# Patient Record
Sex: Male | Born: 1965 | Race: White | Hispanic: No | Marital: Single | Smoking: Never smoker
Health system: Southern US, Community
[De-identification: ages and names within clinical notes are randomized; demographics above are authoritative.]

## PROBLEM LIST (undated history)

## (undated) HISTORY — PX: OTHER SURGICAL HISTORY: SHX169

---

## 1999-01-15 ENCOUNTER — Emergency Department (HOSPITAL_COMMUNITY): Admission: EM | Admit: 1999-01-15 | Discharge: 1999-01-15 | Payer: Self-pay | Admitting: Emergency Medicine

## 2013-02-24 ENCOUNTER — Emergency Department: Payer: Self-pay | Admitting: Emergency Medicine

## 2014-06-14 IMAGING — CR LEFT THUMB 2+V
1 series · 3 of 3 positions shown · non-contrast
Comparison: none

REASON FOR EXAM: hooked, pain, trauma
COMMENTS:

PROCEDURE:     DXR - DXR THUMB LEFT HAND (1ST DIGIT)  - February 24, 2013  [DATE]
RESULT:     Comparison:  None

[Series 1: x finger obl left · 0.14mm/px · 3 of 3 slices shown]
[im 1/3]
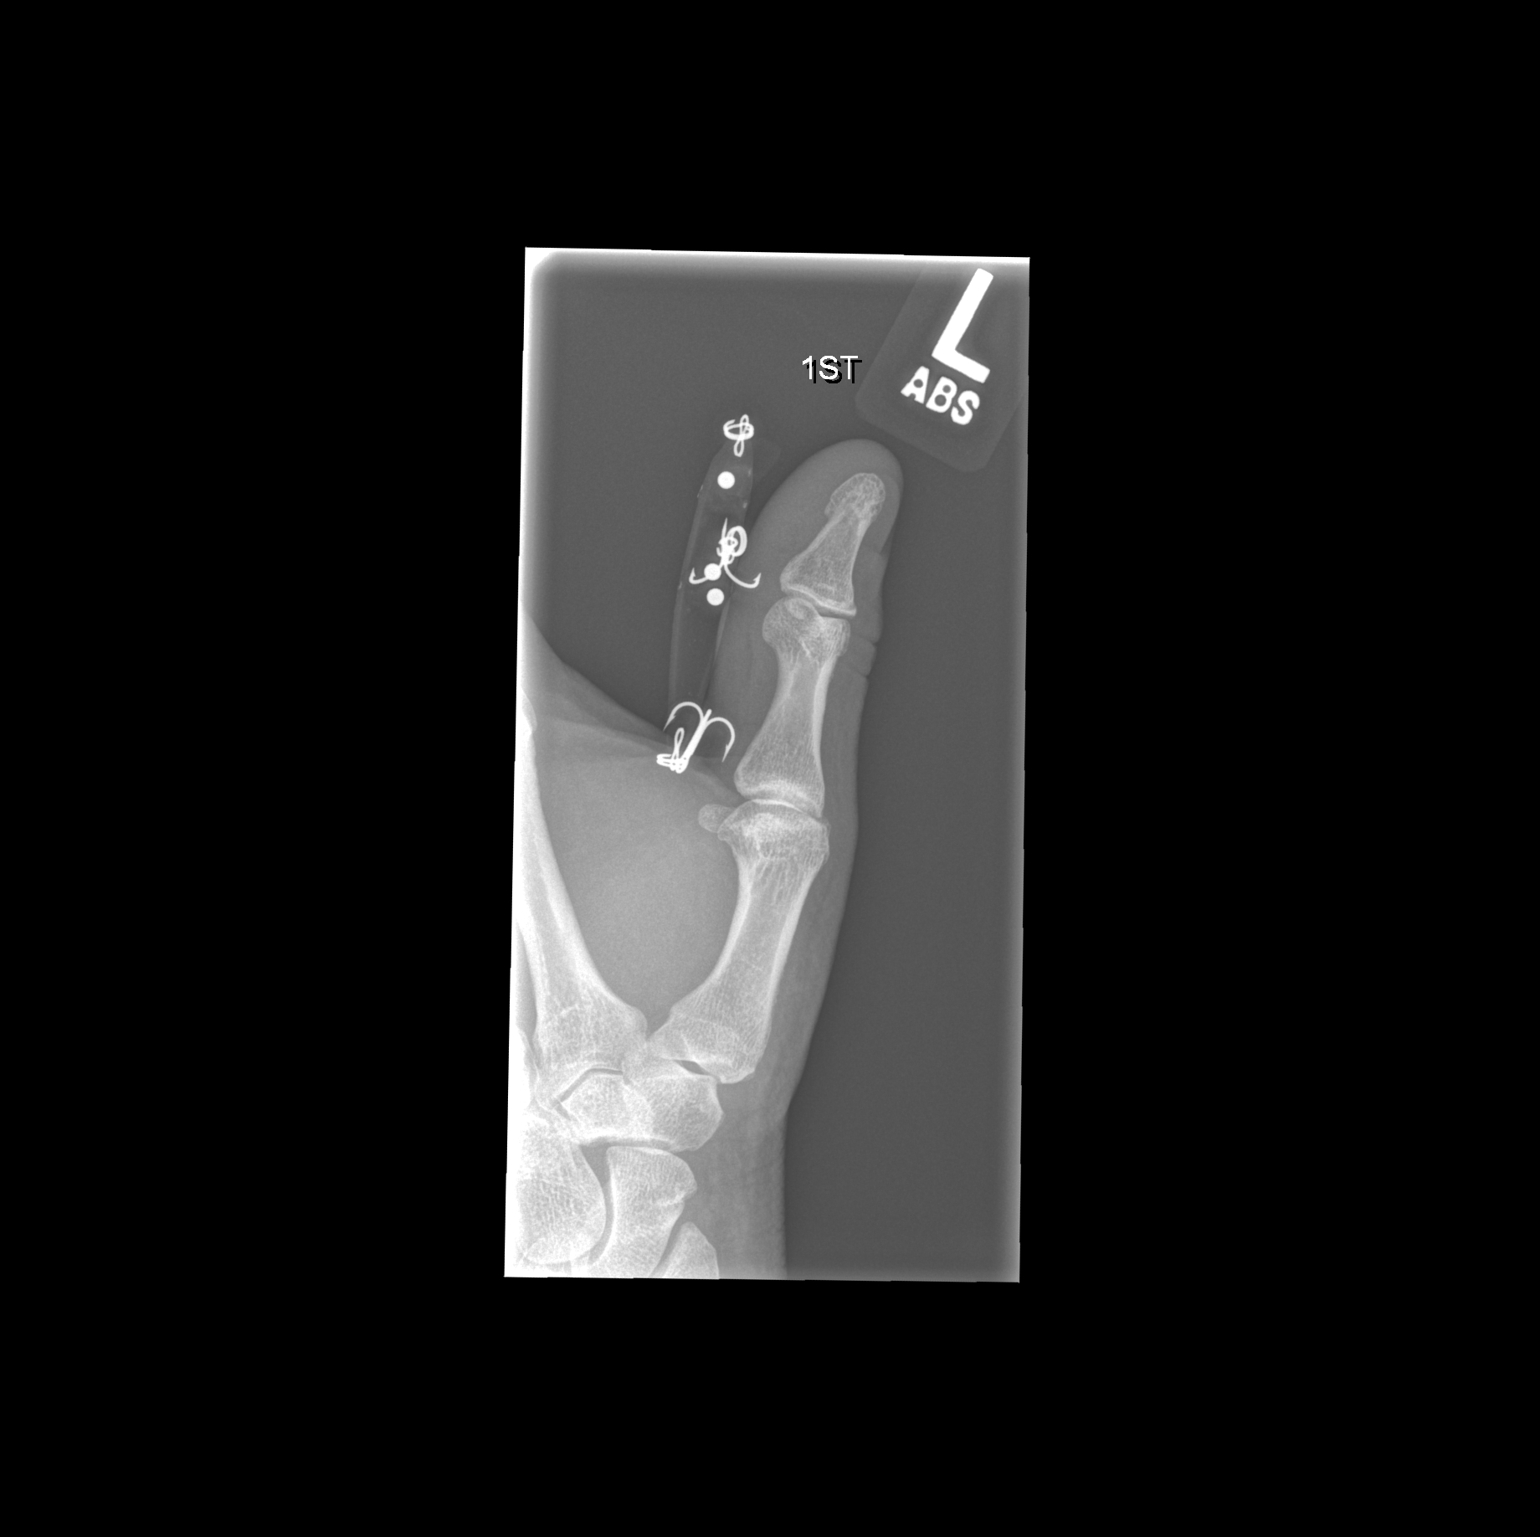
[im 2/3]
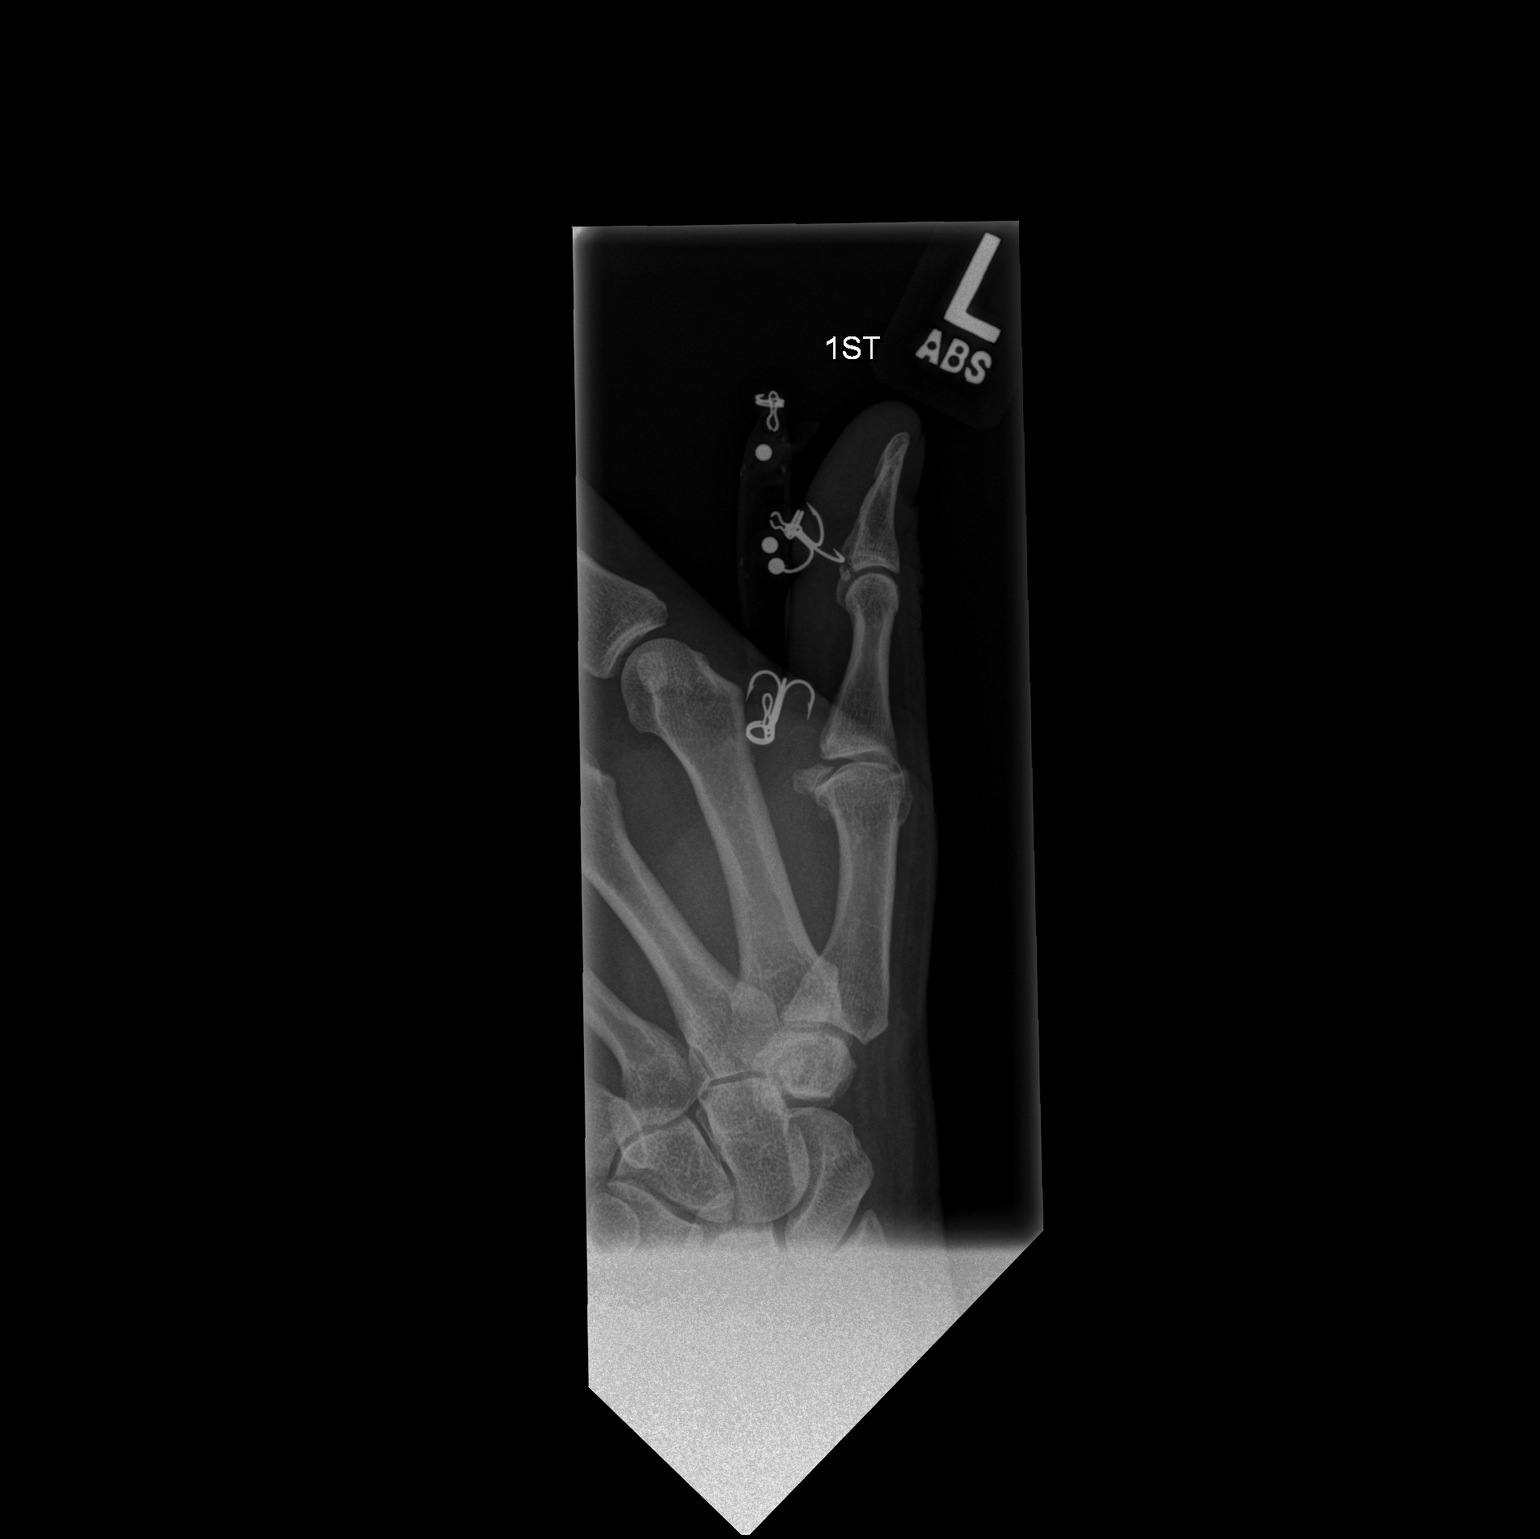
[im 3/3]
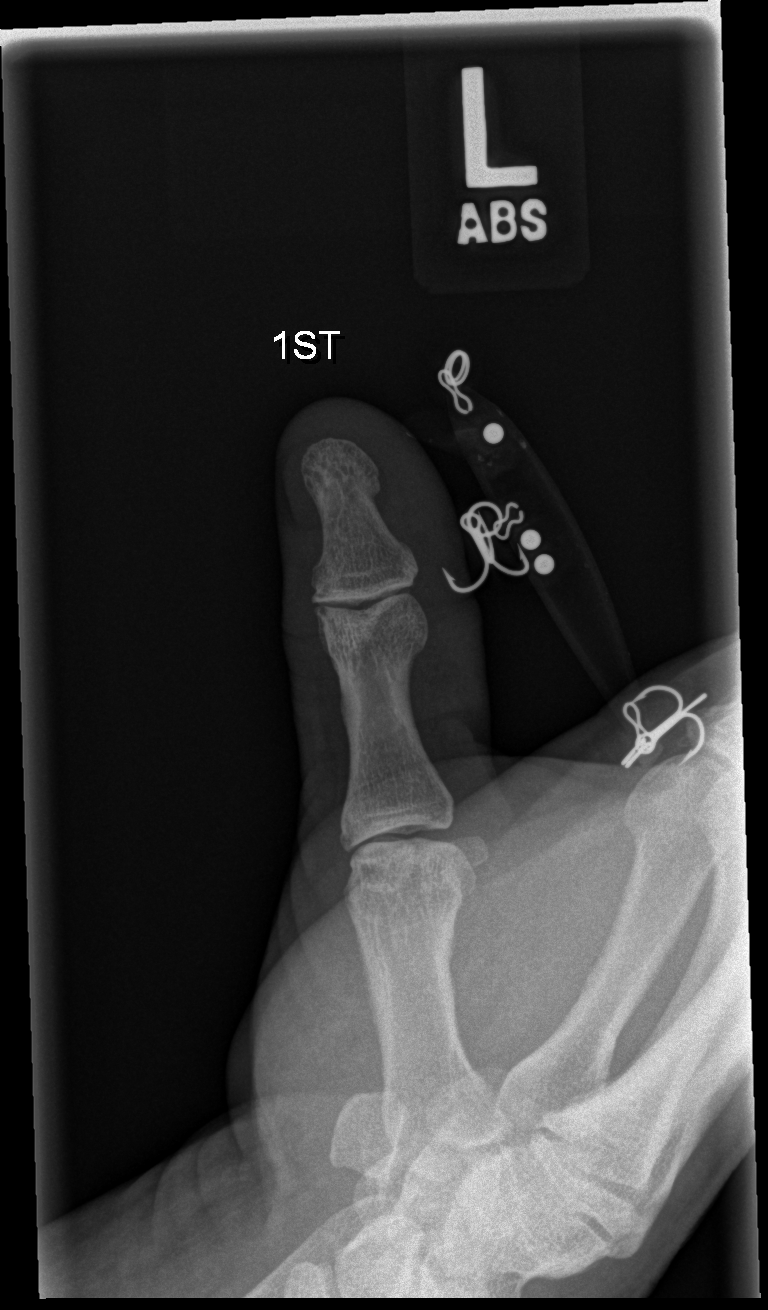

[3 of 3 positions shown; findings below may reference images not displayed]

FINDINGS: Three coned-down views of the left first digit demonstrates no fracture or
dislocation. There is a fishhook within the volar soft tissues of the first
digit at the level of the IP joint. There is a second fish within the soft
tissues along the volar aspect of the second MCP joint.
IMPRESSION: Please see above.

[REDACTED]

## 2015-03-08 ENCOUNTER — Other Ambulatory Visit: Payer: Self-pay | Admitting: Family Medicine

## 2015-03-08 ENCOUNTER — Ambulatory Visit
Admission: RE | Admit: 2015-03-08 | Discharge: 2015-03-08 | Disposition: A | Discharge status: Home / Self Care | Payer: BLUE CROSS/BLUE SHIELD | Source: Ambulatory Visit | Attending: Family Medicine | Admitting: Family Medicine

## 2015-03-08 DIAGNOSIS — M25512 Pain in left shoulder: Secondary | ICD-10-CM

## 2015-03-08 DIAGNOSIS — M79605 Pain in left leg: Secondary | ICD-10-CM

## 2016-06-25 IMAGING — CR DG CLAVICLE*L*
2 series · 2 of 2 positions shown · non-contrast
Comparison: None.

CLINICAL DATA: Soft tissue swelling left sternoclavicular joint

EXAM:
LEFT CLAVICLE - 2+ VIEWS

[t clavicle ap left]
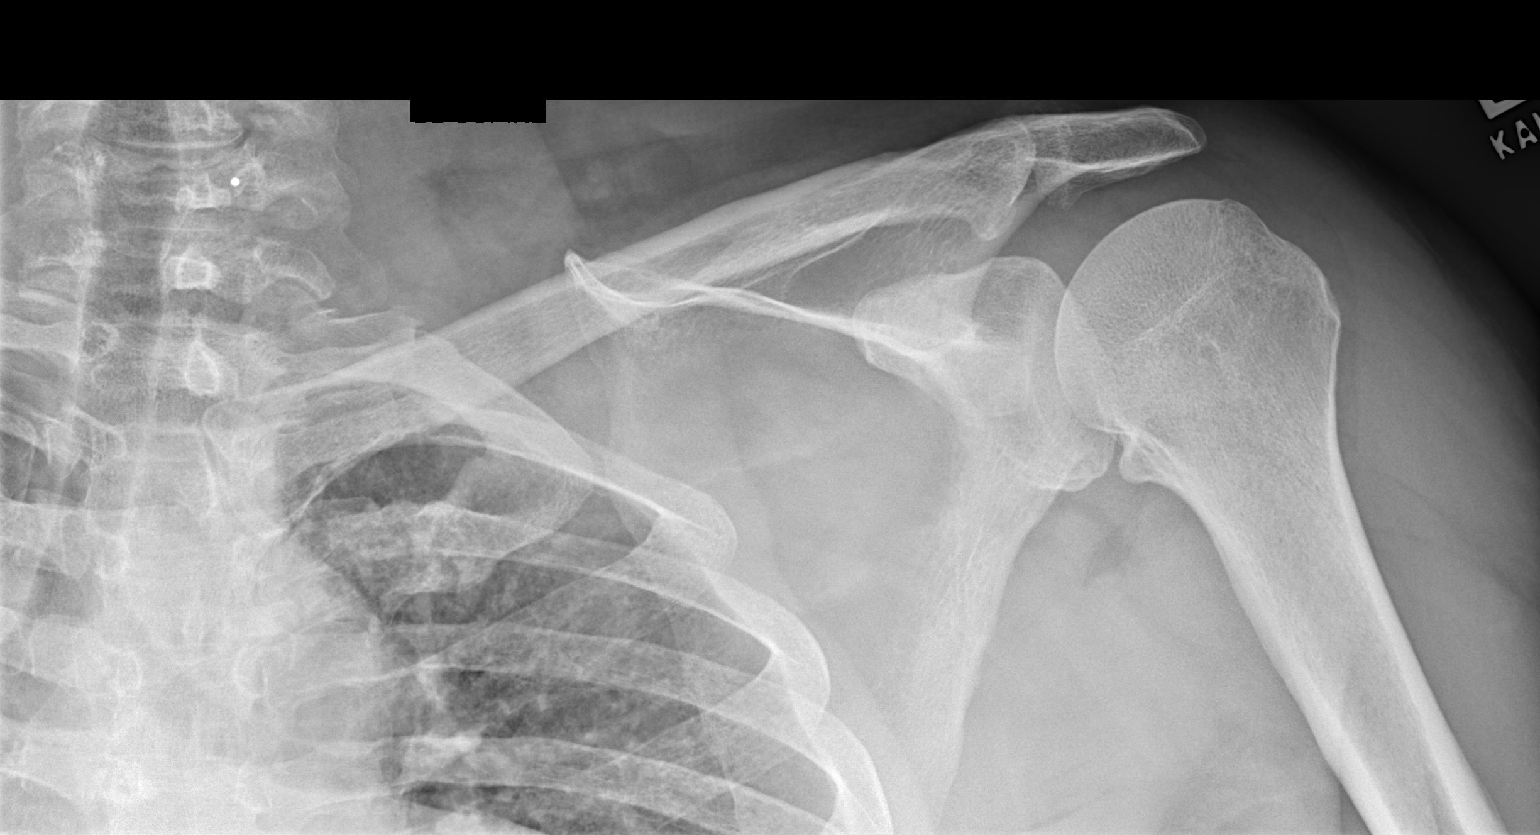

[t clavicle tangential left]
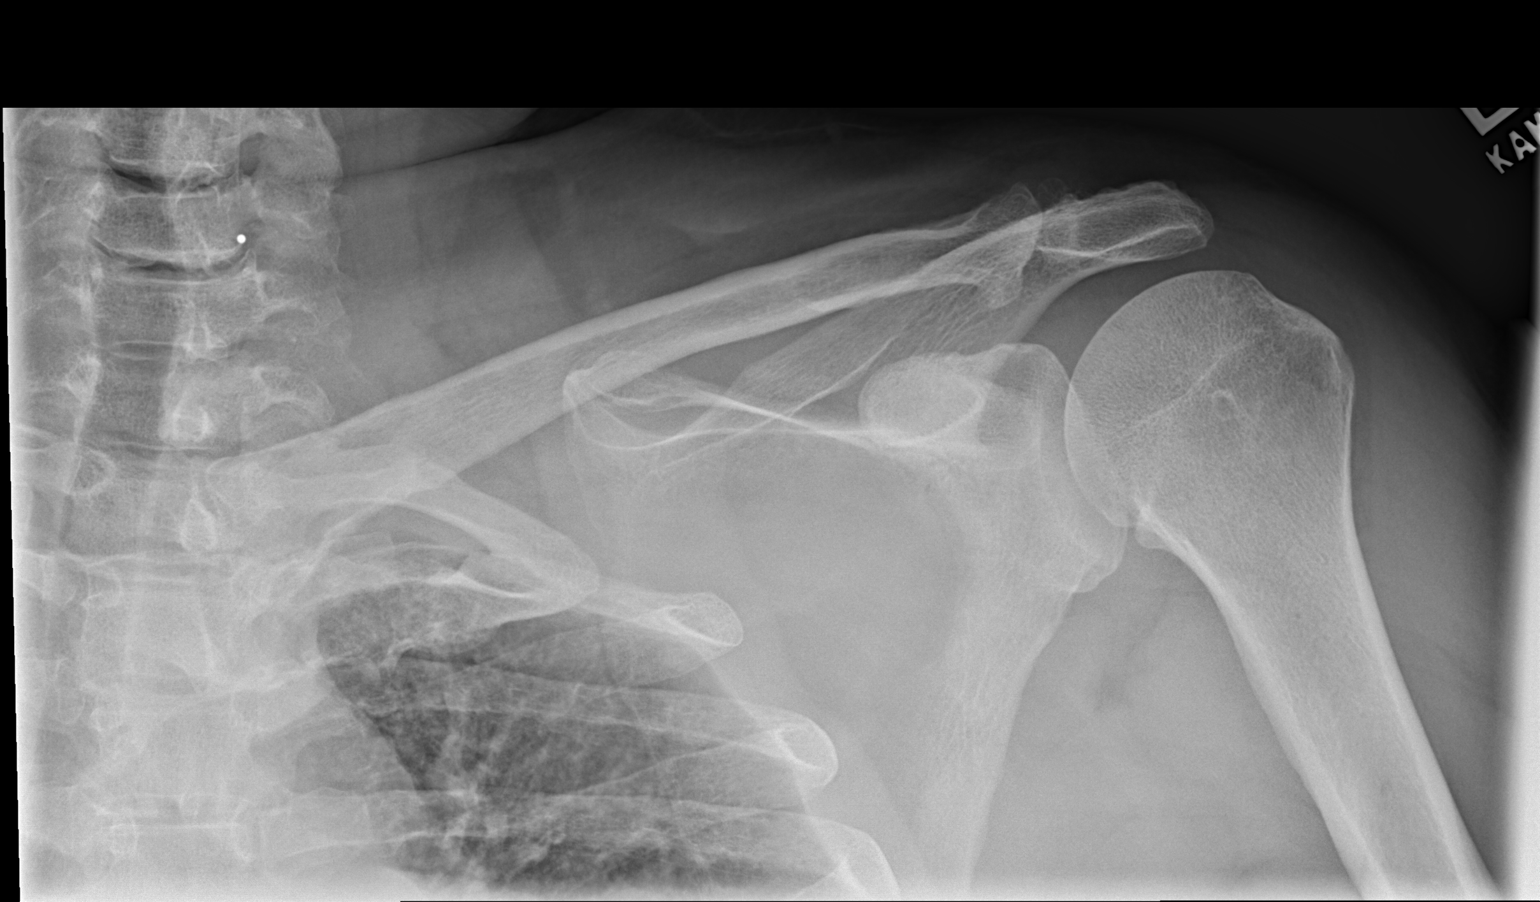

[2 of 2 positions shown; findings below may reference images not displayed]

FINDINGS: Frontal and tilt frontal images obtained. No fracture or
dislocation. There is bony overgrowth at the acromioclavicular
joint. There is no erosive change or bony destruction.
IMPRESSION: Bony overgrowth in the region of the acromioclavicular joint. Bony
overgrowth this nature may lead to impingement syndrome.

No fracture or dislocation.  No erosive change or bony destruction.

No soft tissue masses seen. If there remains concern for potential
pathology in the sternoclavicular joint region, CT, ideally with
intravenous contrast, would be the imaging study of choice for
further assessment.

## 2016-06-25 IMAGING — CR DG TIBIA/FIBULA 2V*R*
2 series · 2 of 2 positions shown · non-contrast
Comparison: None.

CLINICAL DATA: Hit anterior region of tibia-fibula on metal rod at
work 1 month ago, persistent knot

EXAM:
RIGHT TIBIA AND FIBULA - 2 VIEW

[x tib-fib ap right]
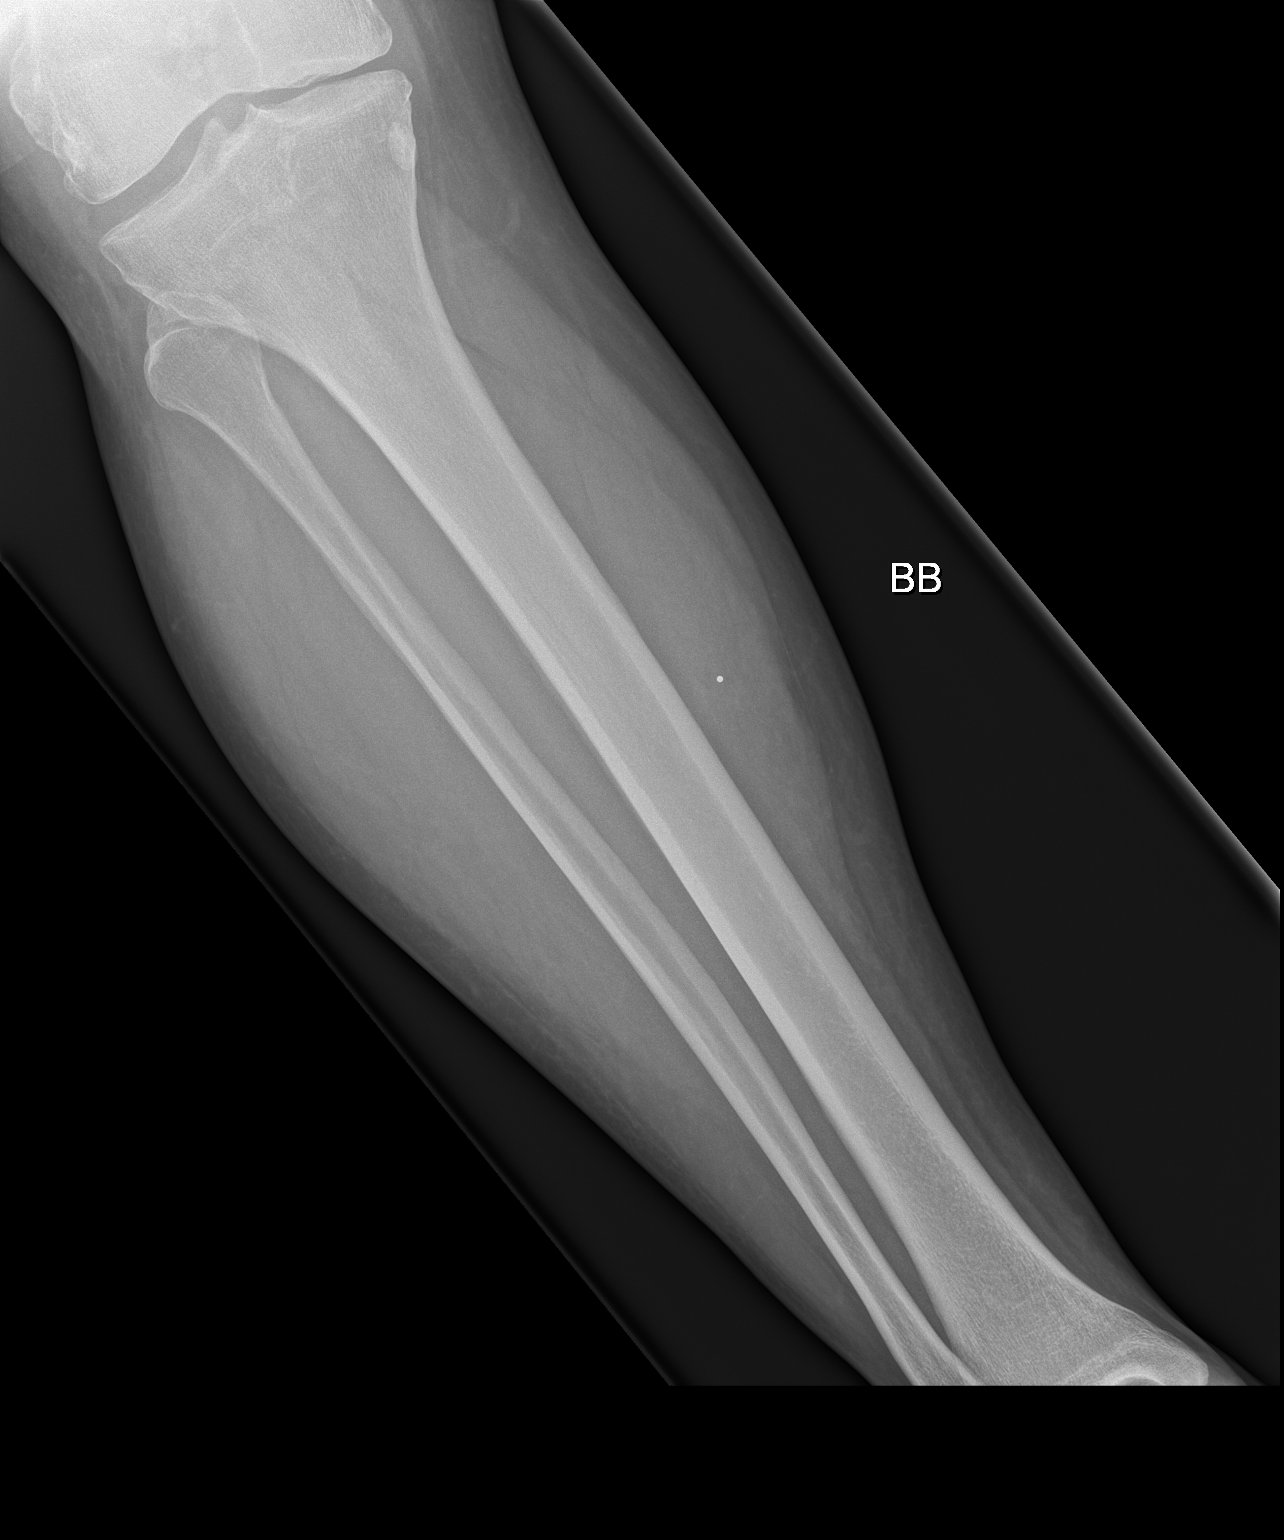

[x tib-fib lat right]
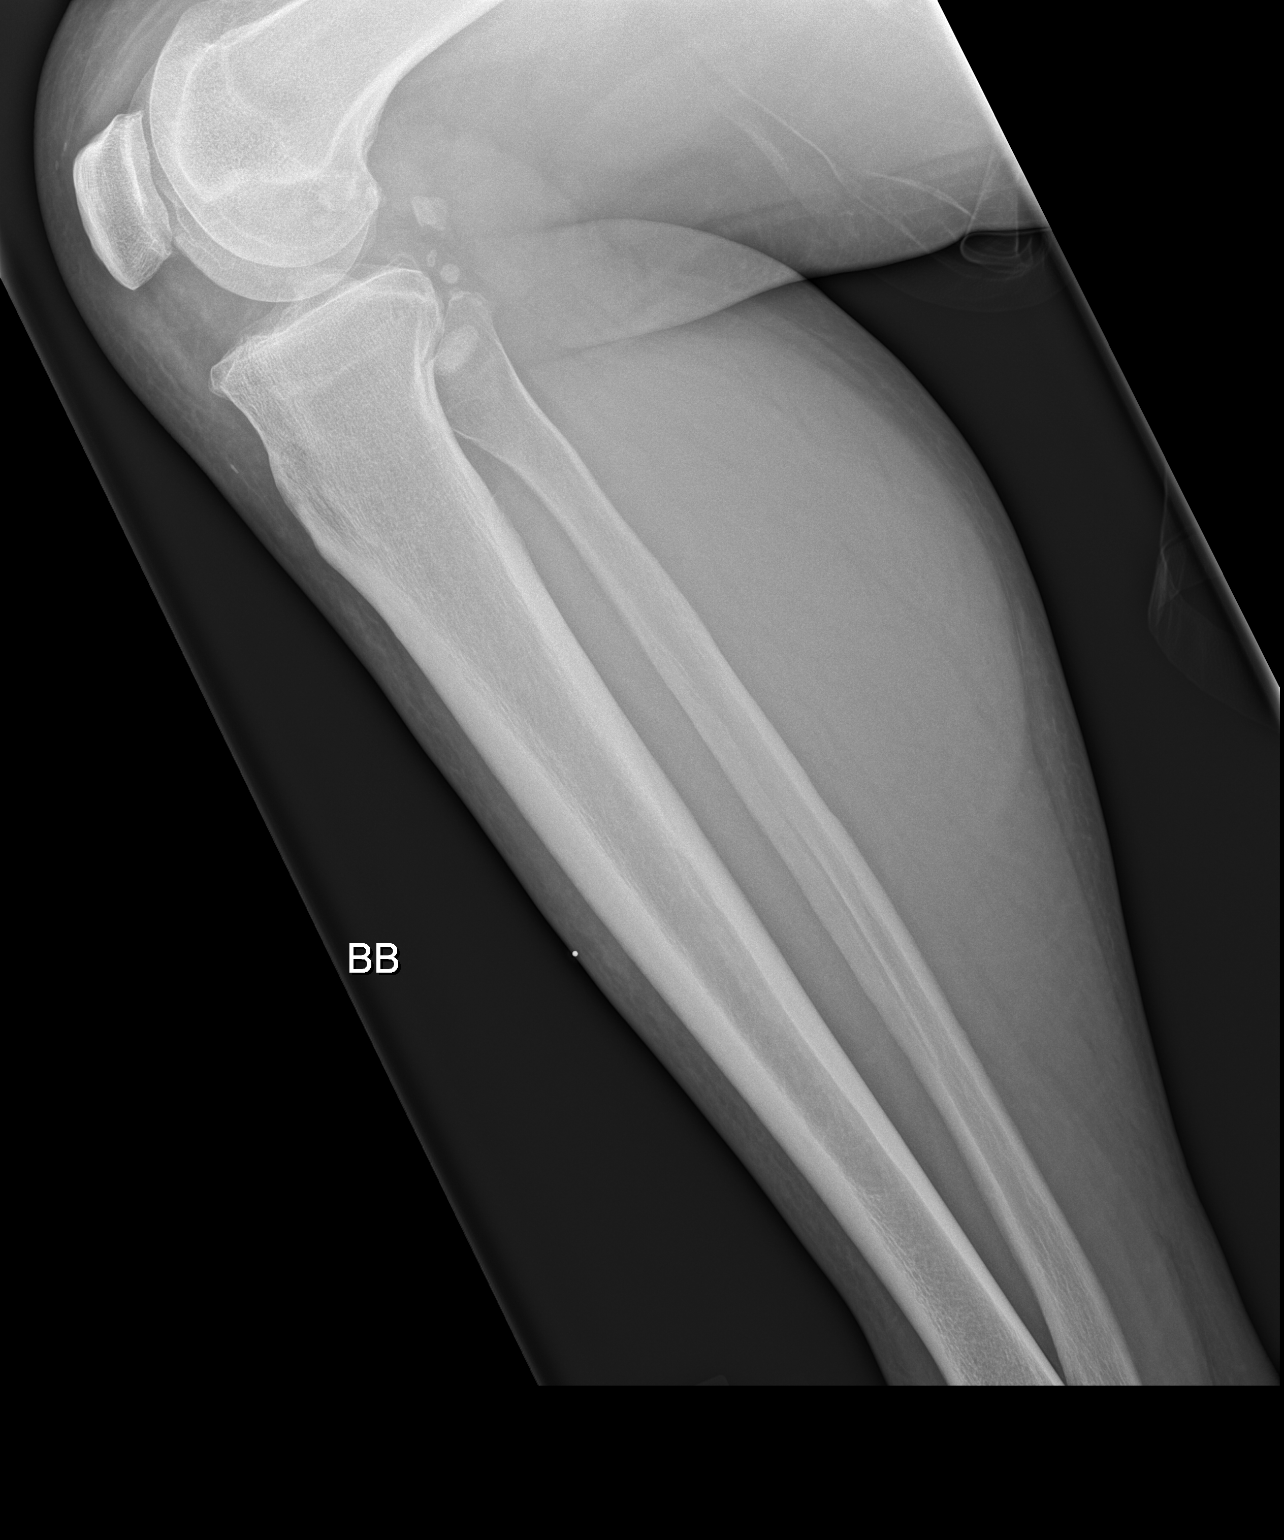

[2 of 2 positions shown; findings below may reference images not displayed]

FINDINGS: Two views of the right tibia-fibula submitted. No acute fracture or
subluxation. A skin marker is noted in area of injury. No focal
abnormality is noted.
IMPRESSION: Negative.

## 2019-08-13 ENCOUNTER — Ambulatory Visit: Payer: Self-pay | Admitting: Urology

## 2022-01-10 ENCOUNTER — Ambulatory Visit (INDEPENDENT_AMBULATORY_CARE_PROVIDER_SITE_OTHER): Payer: BLUE CROSS/BLUE SHIELD | Admitting: Internal Medicine

## 2022-01-10 ENCOUNTER — Encounter: Payer: Self-pay | Admitting: Internal Medicine

## 2022-01-10 VITALS — BP 132/104 | HR 65 | Temp 98.1°F | Resp 16 | Ht 69.0 in | Wt 301.4 lb

## 2022-01-10 DIAGNOSIS — Z6841 Body Mass Index (BMI) 40.0 and over, adult: Secondary | ICD-10-CM | POA: Diagnosis not present

## 2022-01-10 DIAGNOSIS — Z7689 Persons encountering health services in other specified circumstances: Secondary | ICD-10-CM | POA: Diagnosis not present

## 2022-01-10 DIAGNOSIS — Z125 Encounter for screening for malignant neoplasm of prostate: Secondary | ICD-10-CM | POA: Diagnosis not present

## 2022-01-10 DIAGNOSIS — E782 Mixed hyperlipidemia: Secondary | ICD-10-CM

## 2022-01-10 DIAGNOSIS — R030 Elevated blood-pressure reading, without diagnosis of hypertension: Secondary | ICD-10-CM | POA: Diagnosis not present

## 2022-01-10 NOTE — Progress Notes (Signed)
[facility] [address] [place], [place] [address]  Internal MEDICINE  Office Visit Note  Patient Name: Mark Huff  [id number]  [id number]  Date of Service: 01/19/2022   Complaints/HPI Pt is here for establishment of PCP. Chief Complaint  Patient presents with   New Patient (Initial Visit)    Establish care.  Needs to be tested for type of cancer father had.  Colonoscopy needed   HPI [name] is here for establishment of a PCP. He has not been in the care of physician for the Tylenol, does not take any medications, father had cancer and he is brought in by his mother to be tested for all different type of cancers.  Pt works for [employer].  He works between 20 to 40 hours/week, weekend is off.  He admits to snore at night.  Does feel tired and sleepy during the day at times not regularly.  Denies any chest pain shortness of breath fever or chills  Patient will need to have colonoscopy since he had never had one Current Medication: No outpatient encounter medications on file as of 01/10/2022.   No facility-administered encounter medications on file as of 01/10/2022.    Surgical History: Past Surgical History:  Procedure Laterality Date   right knee surgery Right     Medical History: History reviewed. No pertinent past medical history.  Family History: Family History  Problem Relation Age of Onset   Heart failure Father    Esophageal cancer Father     Social History   Socioeconomic History   Marital status: Single    Spouse name: Not on file   Number of children: Not on file   Years of education: Not on file   Highest education level: Not on file  Occupational History   Not on file  Tobacco Use   Smoking status: Never   Smokeless tobacco: Current    Types: Chew   Tobacco comments:    Pt dips since maybe 10 or more years  Substance and Sexual Activity   Alcohol use: Yes    Comment: maybe on holidays (beer)   Drug use: Never   Sexual  activity: Not on file  Other Topics Concern   Not on file  Social History Narrative   Not on file   Social Determinants of Health   Financial Resource Strain: Not on file  Food Insecurity: Not on file  Transportation Needs: Not on file  Physical Activity: Not on file  Stress: Not on file  Social Connections: Not on file  Intimate Partner Violence: Not on file     Review of Systems  Constitutional:  Negative for chills, fatigue and unexpected weight change.  HENT:  Negative for congestion, postnasal drip, rhinorrhea, sneezing and sore throat.   Eyes:  Negative for redness.  Respiratory:  Negative for cough, chest tightness and shortness of breath.   Cardiovascular:  Negative for chest pain and palpitations.  Gastrointestinal:  Negative for abdominal pain, constipation, diarrhea, nausea and vomiting.  Genitourinary:  Negative for dysuria and frequency.  Musculoskeletal:  Negative for arthralgias, back pain, joint swelling and neck pain.  Skin:  Negative for rash.  Neurological: Negative.  Negative for tremors and numbness.  Hematological:  Negative for adenopathy. Does not bruise/bleed easily.  Psychiatric/Behavioral:  Negative for behavioral problems (Depression), sleep disturbance and suicidal ideas. The patient is not nervous/anxious.    Vital Signs: BP (!) 132/104   Pulse 65   Temp 98.1 F (36.7 C)  Resp 16   Ht 5\' 9"  (1.753 m)   Wt (!) 301 lb 6.4 oz (136.7 kg)   SpO2 95%   BMI 44.51 kg/m    Physical Exam Constitutional:      Appearance: Normal appearance.  HENT:     Head: Normocephalic and atraumatic.     Nose: Nose normal.     Mouth/Throat:     Mouth: Mucous membranes are moist.     Pharynx: No posterior oropharyngeal erythema.  Eyes:     Extraocular Movements: Extraocular movements intact.     Pupils: Pupils are equal, round, and reactive to light.  Cardiovascular:     Pulses: Normal pulses.     Heart sounds: Normal heart sounds.  Pulmonary:      Effort: Pulmonary effort is normal.     Breath sounds: Normal breath sounds.  Neurological:     General: No focal deficit present.     Mental Status: He is alert.  Psychiatric:        Mood and Affect: Mood normal.        Behavior: Behavior normal.      Assessment/Plan: 1. Elevated blood pressure reading We will continue to monitor his diastolic blood pressure is high it can be obesity related - CBC with Differential/Platelet - Lipid Panel With LDL/HDL Ratio - TSH - T4, free - Comprehensive metabolic panel - PSA - Prolactin  2. BMI 45.0-49.9, adult (HCC) Elevated BMI since this is first's visit we will avoid discussing his weight - CBC with Differential/Platelet - Lipid Panel With LDL/HDL Ratio - TSH - T4, free - Comprehensive metabolic panel - PSA -Patient has mild gynecomastia.  His prolactin level will be checked  3. Special screening for malignant neoplasm of prostate PSA is ordered  4. Establishing care with new doctor, encounter for Patient has not seen physician in little while we need to have a colonoscopy scheduled in near future  5. Mixed hyperlipidemia Patient has history of hyperlipidemia we will recheck his levels  General Counseling: 01-21-1986 understanding of the findings of todays visit and agrees with plan of treatment. I have discussed any further diagnostic evaluation that may be needed or ordered today. We also reviewed his medications today. he has been encouraged to call the office with any questions or concerns that should arise related to todays visit.  His mother is in the room and answering most of the questions for him  Counseling:  [place] Controlled Substance Database was reviewed by me.  Orders Placed This Encounter  Procedures   CBC with Differential/Platelet   Lipid Panel With LDL/HDL Ratio   TSH   T4, free   Comprehensive metabolic panel   PSA   Prolactin    No orders of the defined types were placed in this  encounter.   Time spent:35 Minutes

## 2022-01-21 LAB — CBC WITH DIFFERENTIAL/PLATELET
Basophils Absolute: 0.1 10*3/uL (ref 0.0–0.2)
Basos: 1 %
EOS (ABSOLUTE): 0.2 10*3/uL (ref 0.0–0.4)
Eos: 2 %
Hematocrit: 45.8 % (ref 37.5–51.0)
Hemoglobin: 15.4 g/dL (ref 13.0–17.7)
Immature Grans (Abs): 0 10*3/uL (ref 0.0–0.1)
Immature Granulocytes: 0 %
Lymphocytes Absolute: 1.8 10*3/uL (ref 0.7–3.1)
Lymphs: 27 %
MCH: 29.7 pg (ref 26.6–33.0)
MCHC: 33.6 g/dL (ref 31.5–35.7)
MCV: 88 fL (ref 79–97)
Monocytes Absolute: 0.8 10*3/uL (ref 0.1–0.9)
Monocytes: 11 %
Neutrophils Absolute: 4 10*3/uL (ref 1.4–7.0)
Neutrophils: 59 %
Platelets: 183 10*3/uL (ref 150–450)
RBC: 5.19 x10E6/uL (ref 4.14–5.80)
RDW: 13.2 % (ref 11.6–15.4)
WBC: 6.8 10*3/uL (ref 3.4–10.8)

## 2022-01-21 LAB — TSH: TSH: 1.58 u[IU]/mL (ref 0.450–4.500)

## 2022-01-21 LAB — LIPID PANEL WITH LDL/HDL RATIO
Cholesterol, Total: 280 mg/dL — ABNORMAL HIGH (ref 100–199)
HDL: 40 mg/dL (ref >39)
LDL Chol Calc (NIH): 198 mg/dL — ABNORMAL HIGH (ref 0–99)
LDL/HDL Ratio: 5 ratio — ABNORMAL HIGH (ref 0.0–3.6)
Triglycerides: 215 mg/dL — ABNORMAL HIGH (ref 0–149)
VLDL Cholesterol Cal: 42 mg/dL — ABNORMAL HIGH (ref 5–40)

## 2022-01-21 LAB — PSA: Prostate Specific Ag, Serum: 0.7 ng/mL (ref 0.0–4.0)

## 2022-01-21 LAB — COMPREHENSIVE METABOLIC PANEL
ALT: 24 IU/L (ref 0–44)
AST: 16 IU/L (ref 0–40)
Albumin/Globulin Ratio: 2.2 (ref 1.2–2.2)
Albumin: 4.3 g/dL (ref 3.8–4.9)
Alkaline Phosphatase: 66 IU/L (ref 44–121)
BUN/Creatinine Ratio: 16 (ref 9–20)
BUN: 17 mg/dL (ref 6–24)
Bilirubin Total: 0.4 mg/dL (ref 0.0–1.2)
CO2: 22 mmol/L (ref 20–29)
Calcium: 9.6 mg/dL (ref 8.7–10.2)
Chloride: 101 mmol/L (ref 96–106)
Creatinine, Ser: 1.06 mg/dL (ref 0.76–1.27)
Globulin, Total: 2 g/dL (ref 1.5–4.5)
Glucose: 122 mg/dL — ABNORMAL HIGH (ref 70–99)
Potassium: 4.3 mmol/L (ref 3.5–5.2)
Sodium: 138 mmol/L (ref 134–144)
Total Protein: 6.3 g/dL (ref 6.0–8.5)
eGFR: 82 mL/min/{1.73_m2} (ref >59)

## 2022-01-21 LAB — T4, FREE: Free T4: 1 ng/dL (ref 0.82–1.77)

## 2022-01-21 LAB — PROLACTIN: Prolactin: 13.1 ng/mL (ref 4.0–15.2)

## 2022-01-25 ENCOUNTER — Telehealth: Payer: Self-pay

## 2022-01-25 NOTE — Telephone Encounter (Signed)
Pt's mother called and was advising that she had to put her credit card on file at [facility] and maybe be charged $25.00 but mother stated she could be charged up to $150.00 if the labs were not coded correctly.  I advised the mother that once they get a bill from [facility] that she can bring to the office and we can check the coding and resend to [facility] if needed.

## 2022-01-31 ENCOUNTER — Telehealth: Payer: Self-pay

## 2022-01-31 ENCOUNTER — Ambulatory Visit (INDEPENDENT_AMBULATORY_CARE_PROVIDER_SITE_OTHER): Payer: BLUE CROSS/BLUE SHIELD | Admitting: Nurse Practitioner

## 2022-01-31 ENCOUNTER — Encounter: Payer: Self-pay | Admitting: Nurse Practitioner

## 2022-01-31 VITALS — BP 121/90 | HR 67 | Temp 98.3°F | Resp 16 | Ht 69.0 in | Wt 301.0 lb

## 2022-01-31 DIAGNOSIS — R7303 Prediabetes: Secondary | ICD-10-CM | POA: Diagnosis not present

## 2022-01-31 DIAGNOSIS — E782 Mixed hyperlipidemia: Secondary | ICD-10-CM

## 2022-01-31 DIAGNOSIS — Z800 Family history of malignant neoplasm of digestive organs: Secondary | ICD-10-CM

## 2022-01-31 DIAGNOSIS — R7301 Impaired fasting glucose: Secondary | ICD-10-CM | POA: Diagnosis not present

## 2022-01-31 DIAGNOSIS — Z1211 Encounter for screening for malignant neoplasm of colon: Secondary | ICD-10-CM

## 2022-01-31 DIAGNOSIS — Z1212 Encounter for screening for malignant neoplasm of rectum: Secondary | ICD-10-CM

## 2022-01-31 LAB — POCT GLYCOSYLATED HEMOGLOBIN (HGB A1C): Hemoglobin A1C: 6 % — AB (ref 4.0–5.6)

## 2022-01-31 NOTE — Telephone Encounter (Signed)
Called and left a message for call back. Documented in referral that Dr. Allegra Lai would just do procedures

## 2022-01-31 NOTE — Telephone Encounter (Signed)
CALLED PATIENT NO ANSWER LEFT VOICEMAIL FOR A CALL BACK Patient needs appointment first egd and colonoscopy

## 2022-01-31 NOTE — Progress Notes (Signed)
[facility] [place], [place] [address]  Internal MEDICINE  Office Visit Note  Patient Name: Mark Huff  [id number]  [id number]  Date of Service: 01/31/2022  Chief Complaint  Patient presents with   Follow-up    HPI Jovany presents for a follow-up visit to review lab results and discuss preventative and routine medical care.  Earlier in May, patient had an office visit to establish care.  He had labs done.  He is accompanied by his mother to the office visit today.  His prolactin level, CBC, thyroid levels were all normal.  His PSA level was normal.  His metabolic panel was grossly normal, kidney and liver function were normal but his fasting glucose was elevated at 122.  His hemoglobin A1c was checked in office today and was elevated at 6.0 which is prediabetic. --His lipid panel was grossly abnormal with an elevated total cholesterol of 280, triglycerides of 215, VLDL of 42 and elevated LDL of 198.  His HDL was low normal at 40.  His LDL/HDL ratio was 5.0 which places him at a higher than average risk of developing cardiovascular disease but less than twice the average risk. --- Patient does not take any medications currently other than Tylenol as needed.  He reiterated that he would prefer not to take any medications if possible. --- His father passed away from esophageal cancer and he has never been screened for colorectal cancer, he is interested in having a referral to gastroenterology ---may also need sleep study, will discuss at next office visit.      Current Medication: No outpatient encounter medications on file as of 01/31/2022.   No facility-administered encounter medications on file as of 01/31/2022.    Surgical History: Past Surgical History:  Procedure Laterality Date   right knee surgery Right     Medical History: History reviewed. No pertinent past medical history.  Family History: Family History  Problem Relation Age of Onset    Heart failure Father    Esophageal cancer Father     Social History   Socioeconomic History   Marital status: Single    Spouse name: Not on file   Number of children: Not on file   Years of education: Not on file   Highest education level: Not on file  Occupational History   Not on file  Tobacco Use   Smoking status: Never   Smokeless tobacco: Current    Types: Chew   Tobacco comments:    Pt dips since maybe 10 or more years  Substance and Sexual Activity   Alcohol use: Yes    Comment: maybe on holidays (beer)   Drug use: Never   Sexual activity: Not on file  Other Topics Concern   Not on file  Social History Narrative   Not on file   Social Determinants of Health   Financial Resource Strain: Not on file  Food Insecurity: Not on file  Transportation Needs: Not on file  Physical Activity: Not on file  Stress: Not on file  Social Connections: Not on file  Intimate Partner Violence: Not on file      Review of Systems  Constitutional:  Negative for chills, fatigue and unexpected weight change.  HENT:  Negative for congestion, postnasal drip, rhinorrhea, sneezing and sore throat.   Eyes:  Negative for redness.  Respiratory:  Negative for cough, chest tightness and shortness of breath.   Cardiovascular:  Negative for chest pain and palpitations.  Gastrointestinal:  Negative  for abdominal pain, constipation, diarrhea, nausea and vomiting.  Genitourinary:  Negative for dysuria and frequency.  Musculoskeletal:  Negative for arthralgias, back pain, joint swelling and neck pain.  Skin:  Negative for rash.  Neurological: Negative.  Negative for tremors and numbness.  Hematological:  Negative for adenopathy. Does not bruise/bleed easily.  Psychiatric/Behavioral:  Negative for behavioral problems (Depression), sleep disturbance and suicidal ideas. The patient is not nervous/anxious.    Vital Signs: BP 121/90   Pulse 67   Temp 98.3 F (36.8 C)   Resp 16   Ht 5\' 9"   (1.753 m)   Wt (!) 301 lb (136.5 kg)   SpO2 97%   BMI 44.45 kg/m    Physical Exam Vitals reviewed.  Constitutional:      General: He is not in acute distress.    Appearance: Normal appearance. He is obese. He is not ill-appearing.  HENT:     Head: Normocephalic and atraumatic.  Eyes:     Pupils: Pupils are equal, round, and reactive to light.  Cardiovascular:     Rate and Rhythm: Normal rate and regular rhythm.  Pulmonary:     Effort: Pulmonary effort is normal. No respiratory distress.  Neurological:     General: No focal deficit present.     Mental Status: He is alert. Mental status is at baseline.  Psychiatric:        Mood and Affect: Mood normal.        Behavior: Behavior normal.       Assessment/Plan: 1. Prediabetes A1c was elevated at 6.0 which is considered prediabetic after having an elevated fasting glucose on his labs.  Diet and lifestyle modifications discussed in detail with the patient and his mother during the office visit today, patient provided with handouts about prediabetes and appropriate diet changes for prediabetes.  Will repeat A1c in 3 months.  2. Mixed hyperlipidemia Cholesterol levels were significantly elevated.  Results were discussed with the patient and his mother, diet modifications and lifestyle modifications discussed in detail.  His mother said that they have over-the-counter fish oil supplement at home and will start that today.  Discussed statin therapy but patient declined at this time.  Will repeat cholesterol panel in 3 months.  3. Impaired fasting glucose Elevated fasting glucose of 122 on his recent labs, A1c checked in office today was elevated at 6.0 which is prediabetic. - POCT glycosylated hemoglobin (Hb A1C)  4. Screening for colorectal cancer patient has never been screened for colorectal cancer and has a family history of esophageal cancer in his father - Ambulatory referral to Gastroenterology  5. Family history of  esophageal cancer Patient's father passed away from esophageal cancer, patient referred to gastroenterology   General Counseling: Corinna Gab understanding of the findings of todays visit and agrees with plan of treatment. I have discussed any further diagnostic evaluation that may be needed or ordered today. We also reviewed his medications today. he has been encouraged to call the office with any questions or concerns that should arise related to todays visit.    Orders Placed This Encounter  Procedures   Ambulatory referral to Gastroenterology   POCT glycosylated hemoglobin (Hb A1C)    No orders of the defined types were placed in this encounter.   Return for CPE earliest available opening, prefers fridays. .  Also discuss possible order for sleep study at next office visit.   Total time spent:30 Minutes Time spent includes review of chart, medications, test results, and follow up  plan with the patient.   [place] Controlled Substance Database was reviewed by me.  This patient was seen by Jonetta Osgood, FNP-C in collaboration with Dr. Clayborn Bigness as a part of collaborative care agreement.   Mattson Dayal R. Valetta Fuller, MSN, FNP-C Internal medicine

## 2022-01-31 NOTE — Telephone Encounter (Signed)
Patient was referred for Colonoscopy and EGD because Father passed with esophageal cancer, will need colonoscopy and upper endoscopy, routine screening, high risk patient due to family history. Do you want to see patient first or can we go on and schedule procedures

## 2022-02-01 ENCOUNTER — Telehealth: Payer: Self-pay

## 2022-02-01 NOTE — Telephone Encounter (Signed)
CALLED PATIENT NO ANSWER LEFT VOICEMAIL FOR A CALL BACK ? ?

## 2022-02-02 ENCOUNTER — Telehealth: Payer: Self-pay

## 2022-02-02 NOTE — Telephone Encounter (Signed)
CALLED PATIENT NO ANSWER LEFT VOICEMAIL FOR A CALL BACK °Letter sent °

## 2022-03-03 ENCOUNTER — Encounter: Payer: BLUE CROSS/BLUE SHIELD | Admitting: Nurse Practitioner

## 2022-03-03 ENCOUNTER — Telehealth: Payer: Self-pay

## 2022-03-03 NOTE — Telephone Encounter (Cosign Needed)
Patient walked in at the time of appointment to check in and front desk proceeded with covid screening. Patient advised the front desk staff that his mother whom he lives with tested positive for covid this week. Front desk advised patient we would have to reschedule patient's cpe but we could do a virtual visit for any covid symptoms and or medication refills. Patient was very unpleasant to staff and walked out. -Meriel Flavors  Patient's mother called back. Asking if this is the front desk. I told her I did not sit at the front, but I could try to assist her. She started yelling stating we do not have the right to refuse to see her son just because she was tested positive for covid. I asked her to explain herself, and that I was not aware of what she was referring to. She then asked, if we communicate with each other in the office. She was very irate, kept yelling and would not let me talk. I asked who her son spoke with here in office, she replied, "that blonde girl at the front". I talked to [place]. We gave call to Ashwath Lasch-Toni  I was last to speak to patient's mother. Patient's mother threatened to call the Health Department and states that we did not do anything to help her son. A virtual visit was offered once again and also advised that pt would need to have a covid test, but patient's mother continued to raise her voice and told us that we do not do anything in our office to help our patients. Patient's mother then hung up the phone during conversation. -Jaiona Simien

## 2022-03-07 ENCOUNTER — Telehealth: Payer: Self-pay

## 2022-03-07 NOTE — Telephone Encounter (Signed)
Lvm for patient's mother to return my call-Toni

## 2022-03-08 ENCOUNTER — Telehealth: Payer: Self-pay

## 2022-03-08 NOTE — Telephone Encounter (Signed)
Left another vm to check on patient-Toni

## 2022-06-22 ENCOUNTER — Ambulatory Visit (INDEPENDENT_AMBULATORY_CARE_PROVIDER_SITE_OTHER): Payer: BLUE CROSS/BLUE SHIELD | Admitting: Gastroenterology

## 2022-06-22 ENCOUNTER — Other Ambulatory Visit: Payer: Self-pay

## 2022-06-22 ENCOUNTER — Encounter: Payer: Self-pay | Admitting: Gastroenterology

## 2022-06-22 VITALS — BP 129/92 | HR 92 | Temp 98.3°F | Ht 69.0 in | Wt 300.2 lb

## 2022-06-22 DIAGNOSIS — Z800 Family history of malignant neoplasm of digestive organs: Secondary | ICD-10-CM

## 2022-06-22 DIAGNOSIS — Z1211 Encounter for screening for malignant neoplasm of colon: Secondary | ICD-10-CM

## 2022-06-22 MED ORDER — NA SULFATE-K SULFATE-MG SULF 17.5-3.13-1.6 GM/177ML PO SOLN: 354.0000 mL | Freq: Once | ORAL | 0 refills | Status: AC

## 2022-06-22 NOTE — Addendum Note (Signed)
Addended by: Wayna Chalet on: 06/22/2022 03:36 PM   Modules accepted: Orders

## 2022-06-22 NOTE — Progress Notes (Signed)
Mark Mood MD, MRCP(U.K) [address]  [address]  [place], [place] [address]  Main: [phone]  Fax: [phone]   Gastroenterology Consultation  Referring Provider:     Lyndon Code, MD Primary Care Physician:  Mark Code, MD Primary Gastroenterologist:  Dr. Wyline Huff  Reason for Consultation:   EGD and colonoscopy        HPI:   Mark Huff is a 56 y.o. y/o male referred for consultation & management  by Dr. Welton Huff, Mark Harper, MD.     He has been referred to see me for colon cancer screening.  Patient also has concerns for esophageal cancer due to family history.  Has normal bowel movements no prior colonoscopy no rectal bleeding no change in bowel habits no unintentional weight loss.  He mentions that his father mother and sister had esophageal cancer unknown type.  No exposure to tobacco in himself or his first-degree relatives.  No excess alcohol consumption.  Presently has no symptoms.  No past medical history on file.  Past Surgical History:  Procedure Laterality Date   right knee surgery Right     Prior to Admission medications   Not on File    Family History  Problem Relation Age of Onset   Heart failure Father    Esophageal cancer Father      Social History   Tobacco Use   Smoking status: Never   Smokeless tobacco: Current    Types: Chew   Tobacco comments:    Pt dips since maybe 10 or more years  Substance Use Topics   Alcohol use: Yes    Comment: maybe on holidays (beer)   Drug use: Never    Allergies as of 06/22/2022   (No Known Allergies)    Review of Systems:    All systems reviewed and negative except where noted in HPI.   Physical Exam:  BP (!) 129/92   Pulse 92   Temp 98.3 F (36.8 C) (Oral)   Ht 5\' 9"  (1.753 m)   Wt (!) 300 lb 3.2 oz (136.2 kg)   BMI 44.33 kg/m  No LMP for male patient. Psych:  Alert and cooperative. Normal Huff and affect. General:   Alert,  Well-developed, well-nourished, pleasant and  cooperative in NAD Head:  Normocephalic and atraumatic. Eyes:  Sclera clear, no icterus.   Conjunctiva pink. Ears:  Normal auditory acuity. Neck:  Supple; no masses or thyromegaly. Lungs:  Respirations even and unlabored.  Clear throughout to auscultation.   No wheezes, crackles, or rhonchi. No acute distress. Heart:  Regular rate and rhythm; no murmurs, clicks, rubs, or gallops. Abdomen:  Normal bowel sounds.  No bruits.  Soft, non-tender and non-distended without masses, hepatosplenomegaly or hernias noted.  No guarding or rebound tenderness.    Neurologic:  Alert and oriented x3;  grossly normal neurologically. Psych:  Alert and cooperative. Normal Huff and affect.  Imaging Studies: No results found.  Assessment and Plan:   Mark Huff is a 56 y.o. y/o male has been referred for: Cancer screening average risk .  patient's father passed away from esophageal cancer. Father, mother and another member of family had esophageal cancer. Unknown type.  Very unusual for having multiple family members who have had esophageal cancer.  Discussed there are no clear guidelines with regards to screening but in his case risk factors for esophageal adenocarcinoma include obesity, Caucasian race, and age.  He has no symptoms of reflux that we know  of and no known history of Barrett's esophagus.  I discussed with him that it would not be unreasonable to perform an upper endoscopy at the time of colonoscopy for colon cancer screening.  Plan  EGD+colonoscopy  Will also inform him about genetic testing if agreeable we will refer him to genetic counselor  I have discussed alternative options, risks & benefits,  which include, but are not limited to, bleeding, infection, perforation,respiratory complication & drug reaction.  The patient agrees with this plan & written consent will be obtained.    Follow up in as needed  Dr Jonathon Bellows MD,MRCP(U.K)

## 2022-06-23 ENCOUNTER — Telehealth: Payer: Self-pay

## 2022-06-23 NOTE — Telephone Encounter (Signed)
Patient cancelled his colonoscopy due to being out of network.

## 2022-07-26 ENCOUNTER — Encounter: Payer: BLUE CROSS/BLUE SHIELD | Admitting: Licensed Clinical Social Worker

## 2022-07-26 ENCOUNTER — Other Ambulatory Visit: Payer: BLUE CROSS/BLUE SHIELD

## 2022-07-28 ENCOUNTER — Ambulatory Visit: Admit: 2022-07-28 | Payer: BLUE CROSS/BLUE SHIELD | Admitting: Gastroenterology

## 2022-07-28 SURGERY — COLONOSCOPY WITH PROPOFOL
Anesthesia: General

## 2023-03-26 ENCOUNTER — Ambulatory Visit
Admission: RE | Admit: 2023-03-26 | Discharge: 2023-03-26 | Disposition: A | Discharge status: Home / Self Care | Payer: Medicaid Other | Source: Ambulatory Visit | Attending: Family Medicine | Admitting: Family Medicine

## 2023-03-26 ENCOUNTER — Other Ambulatory Visit: Payer: Self-pay | Admitting: Family Medicine

## 2023-03-26 DIAGNOSIS — M7989 Other specified soft tissue disorders: Secondary | ICD-10-CM

## 2023-04-05 ENCOUNTER — Other Ambulatory Visit: Payer: Self-pay | Admitting: Sports Medicine

## 2023-04-05 DIAGNOSIS — M899 Disorder of bone, unspecified: Secondary | ICD-10-CM

## 2023-04-05 DIAGNOSIS — M25462 Effusion, left knee: Secondary | ICD-10-CM

## 2023-07-05 ENCOUNTER — Other Ambulatory Visit: Payer: Self-pay | Admitting: Sports Medicine

## 2023-07-05 DIAGNOSIS — M899 Disorder of bone, unspecified: Secondary | ICD-10-CM

## 2023-07-06 ENCOUNTER — Other Ambulatory Visit: Payer: Self-pay | Admitting: Sports Medicine

## 2023-07-06 DIAGNOSIS — M899 Disorder of bone, unspecified: Secondary | ICD-10-CM

## 2023-07-19 ENCOUNTER — Encounter: Payer: Self-pay | Admitting: Sports Medicine

## 2023-07-25 ENCOUNTER — Inpatient Hospital Stay
Admission: RE | Admit: 2023-07-25 | Discharge: 2023-07-25 | Disposition: A | Discharge status: Home / Self Care | Payer: BLUE CROSS/BLUE SHIELD | Source: Ambulatory Visit | Attending: Sports Medicine

## 2023-07-25 ENCOUNTER — Ambulatory Visit
Admission: RE | Admit: 2023-07-25 | Discharge: 2023-07-25 | Disposition: A | Discharge status: Home / Self Care | Payer: Medicaid Other | Source: Ambulatory Visit | Attending: Sports Medicine | Admitting: Sports Medicine

## 2023-07-25 DIAGNOSIS — M899 Disorder of bone, unspecified: Secondary | ICD-10-CM

## 2023-07-25 MED ORDER — GADOPICLENOL 0.5 MMOL/ML IV SOLN
10.0000 mL | Freq: Once | INTRAVENOUS | Status: AC | PRN
  Administered 2023-07-25: 10 mL via INTRAVENOUS
